# Patient Record
Sex: Female | Born: 1995
Health system: Southern US, Community
[De-identification: ages and names within clinical notes are randomized; demographics above are authoritative.]

---
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
## 2016-10-24 ENCOUNTER — Encounter: Payer: Self-pay | Admitting: Gynecology

## 2016-11-27 ENCOUNTER — Telehealth: Payer: Self-pay | Admitting: Family

## 2017-01-21 ENCOUNTER — Ambulatory Visit (INDEPENDENT_AMBULATORY_CARE_PROVIDER_SITE_OTHER): Payer: Managed Care, Other (non HMO) | Admitting: Family

## 2017-01-21 ENCOUNTER — Encounter: Payer: Self-pay | Admitting: Family

## 2017-01-21 VITALS — BP 118/72 | HR 76 | Temp 98.2°F | Resp 16 | Ht 63.0 in | Wt 123.8 lb

## 2017-01-21 DIAGNOSIS — Z0000 Encounter for general adult medical examination without abnormal findings: Secondary | ICD-10-CM

## 2017-01-21 DIAGNOSIS — E282 Polycystic ovarian syndrome: Secondary | ICD-10-CM | POA: Diagnosis not present

## 2017-01-21 MED ORDER — SPIRONOLACTONE 25 MG PO TABS: 25.0000 mg | ORAL_TABLET | Freq: Every day | ORAL | 3 refills | Status: DC

## 2017-01-21 MED ORDER — DROSPIRENONE-ETHINYL ESTRADIOL 3-0.02 MG PO TABS: 1.0000 | ORAL_TABLET | Freq: Every day | ORAL | 4 refills | Status: DC

## 2017-01-21 NOTE — Patient Instructions (Signed)
Thank you for choosing [facility].  SUMMARY AND INSTRUCTIONS:  Please continue to take your medication as prescribed.   You are doing great!  Good luck this semester!  Medication:  Your prescription(s) have been submitted to your pharmacy or been printed and provided for you. Please take as directed and contact our office if you believe you are having problem(s) with the medication(s) or have any questions.  Labs:  Please stop by the lab on the lower level of the building for your blood work. Your results will be released to [facility] (or called to you) after review, usually within 72 hours after test completion. If any changes need to be made, you will be notified at that same time.  1.) The lab is open from 7:30am to 5:30 pm Monday-Friday 2.) No appointment is necessary 3.) Fasting (if needed) is 6-8 hours after food and drink; black coffee and water are okay   Follow up:  If your symptoms worsen or fail to improve, please contact our office for further instruction, or in case of emergency go directly to the emergency room at the closest medical facility.    Health Maintenance, Female Adopting a healthy lifestyle and getting preventive care can go a long way to promote health and wellness. Talk with your health care provider about what schedule of regular examinations is right for you. This is a good chance for you to check in with your provider about disease prevention and staying healthy. In between checkups, there are plenty of things you can do on your own. Experts have done a lot of research about which lifestyle changes and preventive measures are most likely to keep you healthy. Ask your health care provider for more information. Weight and diet Eat a healthy diet  Be sure to include plenty of vegetables, fruits, low-fat dairy products, and lean protein.  Do not eat a lot of foods high in solid fats, added sugars, or salt.  Get regular exercise. This is one of the  most important things you can do for your health. ? Most adults should exercise for at least 150 minutes each week. The exercise should increase your heart rate and make you sweat (moderate-intensity exercise). ? Most adults should also do strengthening exercises at least twice a week. This is in addition to the moderate-intensity exercise.  Maintain a healthy weight  Body mass index (BMI) is a measurement that can be used to identify possible weight problems. It estimates body fat based on height and weight. Your health care provider can help determine your BMI and help you achieve or maintain a healthy weight.  For females 26 years of age and older: ? A BMI below 18.5 is considered underweight. ? A BMI of 18.5 to 24.9 is normal. ? A BMI of 25 to 29.9 is considered overweight. ? A BMI of 30 and above is considered obese.  Watch levels of cholesterol and blood lipids  You should start having your blood tested for lipids and cholesterol at 20 years of age, then have this test every 5 years.  You may need to have your cholesterol levels checked more often if: ? Your lipid or cholesterol levels are high. ? You are older than 20 years of age. ? You are at high risk for heart disease.  Cancer screening Lung Cancer  Lung cancer screening is recommended for adults 64-23 years old who are at high risk for lung cancer because of a history of smoking.  A yearly low-dose CT  scan of the lungs is recommended for people who: ? Currently smoke. ? Have quit within the past 15 years. ? Have at least a 30-pack-year history of smoking. A pack year is smoking an average of one pack of cigarettes a day for 1 year.  Yearly screening should continue until it has been 15 years since you quit.  Yearly screening should stop if you develop a health problem that would prevent you from having lung cancer treatment.  Breast Cancer  Practice breast self-awareness. This means understanding how your breasts  normally appear and feel.  It also means doing regular breast self-exams. Let your health care provider know about any changes, no matter how small.  If you are in your 20s or 30s, you should have a clinical breast exam (CBE) by a health care provider every 1-3 years as part of a regular health exam.  If you are 3 or older, have a CBE every year. Also consider having a breast X-ray (mammogram) every year.  If you have a family history of breast cancer, talk to your health care provider about genetic screening.  If you are at high risk for breast cancer, talk to your health care provider about having an MRI and a mammogram every year.  Breast cancer gene (BRCA) assessment is recommended for women who have family members with BRCA-related cancers. BRCA-related cancers include: ? Breast. ? Ovarian. ? Tubal. ? Peritoneal cancers.  Results of the assessment will determine the need for genetic counseling and BRCA1 and BRCA2 testing.  Cervical Cancer Your health care provider may recommend that you be screened regularly for cancer of the pelvic organs (ovaries, uterus, and vagina). This screening involves a pelvic examination, including checking for microscopic changes to the surface of your cervix (Pap test). You may be encouraged to have this screening done every 3 years, beginning at age 41.  For women ages 109-65, health care providers may recommend pelvic exams and Pap testing every 3 years, or they may recommend the Pap and pelvic exam, combined with testing for human papilloma virus (HPV), every 5 years. Some types of HPV increase your risk of cervical cancer. Testing for HPV may also be done on women of any age with unclear Pap test results.  Other health care providers may not recommend any screening for nonpregnant women who are considered low risk for pelvic cancer and who do not have symptoms. Ask your health care provider if a screening pelvic exam is right for you.  If you have had  past treatment for cervical cancer or a condition that could lead to cancer, you need Pap tests and screening for cancer for at least 20 years after your treatment. If Pap tests have been discontinued, your risk factors (such as having a new sexual partner) need to be reassessed to determine if screening should resume. Some women have medical problems that increase the chance of getting cervical cancer. In these cases, your health care provider may recommend more frequent screening and Pap tests.  Colorectal Cancer  This type of cancer can be detected and often prevented.  Routine colorectal cancer screening usually begins at 20 years of age and continues through 20 years of age.  Your health care provider may recommend screening at an earlier age if you have risk factors for colon cancer.  Your health care provider may also recommend using home test kits to check for hidden blood in the stool.  A small camera at the end of a tube can  be used to examine your colon directly (sigmoidoscopy or colonoscopy). This is done to check for the earliest forms of colorectal cancer.  Routine screening usually begins at age 33.  Direct examination of the colon should be repeated every 5-10 years through 20 years of age. However, you may need to be screened more often if early forms of precancerous polyps or small growths are found.  Skin Cancer  Check your skin from head to toe regularly.  Tell your health care provider about any new moles or changes in moles, especially if there is a change in a mole's shape or color.  Also tell your health care provider if you have a mole that is larger than the size of a pencil eraser.  Always use sunscreen. Apply sunscreen liberally and repeatedly throughout the day.  Protect yourself by wearing long sleeves, pants, a wide-brimmed hat, and sunglasses whenever you are outside.  Heart disease, diabetes, and high blood pressure  High blood pressure causes heart  disease and increases the risk of stroke. High blood pressure is more likely to develop in: ? People who have blood pressure in the high end of the normal range (130-139/85-89 mm Hg). ? People who are overweight or obese. ? People who are African American.  If you are 21-30 years of age, have your blood pressure checked every 3-5 years. If you are 35 years of age or older, have your blood pressure checked every year. You should have your blood pressure measured twice-once when you are at a hospital or clinic, and once when you are not at a hospital or clinic. Record the average of the two measurements. To check your blood pressure when you are not at a hospital or clinic, you can use: ? An automated blood pressure machine at a pharmacy. ? A home blood pressure monitor.  If you are between 61 years and 50 years old, ask your health care provider if you should take aspirin to prevent strokes.  Have regular diabetes screenings. This involves taking a blood sample to check your fasting blood sugar level. ? If you are at a normal weight and have a low risk for diabetes, have this test once every three years after 20 years of age. ? If you are overweight and have a high risk for diabetes, consider being tested at a younger age or more often. Preventing infection Hepatitis B  If you have a higher risk for hepatitis B, you should be screened for this virus. You are considered at high risk for hepatitis B if: ? You were born in a country where hepatitis B is common. Ask your health care provider which countries are considered high risk. ? Your parents were born in a high-risk country, and you have not been immunized against hepatitis B (hepatitis B vaccine). ? You have HIV or AIDS. ? You use needles to inject street drugs. ? You live with someone who has hepatitis B. ? You have had sex with someone who has hepatitis B. ? You get hemodialysis treatment. ? You take certain medicines for conditions,  including cancer, organ transplantation, and autoimmune conditions.  Hepatitis C  Blood testing is recommended for: ? Everyone born from [birth year] through 1964. ? Anyone with known risk factors for hepatitis C.  Sexually transmitted infections (STIs)  You should be screened for sexually transmitted infections (STIs) including gonorrhea and chlamydia if: ? You are sexually active and are younger than 20 years of age. ? You are older than 20 years  of age and your health care provider tells you that you are at risk for this type of infection. ? Your sexual activity has changed since you were last screened and you are at an increased risk for chlamydia or gonorrhea. Ask your health care provider if you are at risk.  If you do not have HIV, but are at risk, it may be recommended that you take a prescription medicine daily to prevent HIV infection. This is called pre-exposure prophylaxis (PrEP). You are considered at risk if: ? You are sexually active and do not regularly use condoms or know the HIV status of your partner(s). ? You take drugs by injection. ? You are sexually active with a partner who has HIV.  Talk with your health care provider about whether you are at high risk of being infected with HIV. If you choose to begin PrEP, you should first be tested for HIV. You should then be tested every 3 months for as long as you are taking PrEP. Pregnancy  If you are premenopausal and you may become pregnant, ask your health care provider about preconception counseling.  If you may become pregnant, take 400 to 800 micrograms (mcg) of folic acid every day.  If you want to prevent pregnancy, talk to your health care provider about birth control (contraception). Osteoporosis and menopause  Osteoporosis is a disease in which the bones lose minerals and strength with aging. This can result in serious bone fractures. Your risk for osteoporosis can be identified using a bone density scan.  If you are  47 years of age or older, or if you are at risk for osteoporosis and fractures, ask your health care provider if you should be screened.  Ask your health care provider whether you should take a calcium or vitamin D supplement to lower your risk for osteoporosis.  Menopause may have certain physical symptoms and risks.  Hormone replacement therapy may reduce some of these symptoms and risks. Talk to your health care provider about whether hormone replacement therapy is right for you. Follow these instructions at home:  Schedule regular health, dental, and eye exams.  Stay current with your immunizations.  Do not use any tobacco products including cigarettes, chewing tobacco, or electronic cigarettes.  If you are pregnant, do not drink alcohol.  If you are breastfeeding, limit how much and how often you drink alcohol.  Limit alcohol intake to no more than 1 drink per day for nonpregnant women. One drink equals 12 ounces of beer, 5 ounces of wine, or 1 ounces of hard liquor.  Do not use street drugs.  Do not share needles.  Ask your health care provider for help if you need support or information about quitting drugs.  Tell your health care provider if you often feel depressed.  Tell your health care provider if you have ever been abused or do not feel safe at home. This information is not intended to replace advice given to you by your health care provider. Make sure you discuss any questions you have with your health care provider. Document Released: 12/11/2010 Document Revised: 11/03/2015 Document Reviewed: 03/01/2015 Elsevier Interactive Patient Education  [employer].

## 2017-01-21 NOTE — Progress Notes (Signed)
Subjective:    Patient ID: Stephanie Daugherty, female    DOB: December 31, 1995, 21 y.o.   MRN: [id number]  Chief Complaint  Patient presents with  . CPE    not fasting    HPI:  Stephanie Daugherty is a 21 y.o. female who presents today for an annual wellness visit.   1) Health Maintenance -   Diet - Averaging about 2-3 meals per day consisting of a regular diet.   Exercise - Continues to walk daily and on campus   2) Preventative Exams / Immunizations:  Dental -- Due for exam   Vision -- Up to date   Health Maintenance  Topic Date Due  . CHLAMYDIA SCREENING  08/23/2011  . HIV Screening  08/23/2011  . TETANUS/TDAP  08/23/2015  . INFLUENZA VACCINE  01/09/2017    Immunization History  Administered Date(s) Administered  . HPV 9-valent 01/19/2015, 12/26/2015    No Known Allergies   Outpatient Medications Prior to Visit  Medication Sig Dispense Refill  . drospirenone-ethinyl estradiol (YAZ) 3-0.02 MG tablet Take 1 tablet by mouth daily. 3 Package 4  . spironolactone (ALDACTONE) 25 MG tablet Take 1 tablet (25 mg total) by mouth daily. 90 tablet 4   No facility-administered medications prior to visit.      History reviewed. No pertinent past medical history.   History reviewed. No pertinent surgical history.   Family History  Problem Relation Age of Onset  . Asthma Mother   . Healthy Father   . Rheum arthritis Maternal Grandmother   . Hypertension Maternal Grandmother   . Healthy Maternal Grandfather   . Rheum arthritis Paternal Grandmother   . Hypertension Paternal Grandmother   . Healthy Paternal Grandfather      Social History   Social History  . Marital status: Single    Spouse name: N/A  . Number of children: 0  . Years of education: 22   Occupational History  . 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 Smoking status: Never Smoker  . Smokeless tobacco: Never Used  . Alcohol use No  . Drug use: No  . Sexual  activity: No   Other Topics Concern  . Not on file   Social History Narrative   Fun: Read, crochet    Denies abuse and feels safe where she lives      Review of Systems  Constitutional: Denies fever, chills, fatigue, or significant weight gain/loss. HENT: Head: Denies headache or neck pain Ears: Denies changes in hearing, ringing in ears, earache, drainage Nose: Denies discharge, stuffiness, itching, nosebleed, sinus pain Throat: Denies sore throat, hoarseness, dry mouth, sores, thrush Eyes: Denies loss/changes in vision, pain, redness, blurry/double vision, flashing lights Cardiovascular: Denies chest pain/discomfort, tightness, palpitations, shortness of breath with activity, difficulty lying down, swelling, sudden awakening with shortness of breath Respiratory: Denies shortness of breath, cough, sputum production, wheezing Gastrointestinal: Denies dysphasia, heartburn, change in appetite, nausea, change in bowel habits, rectal bleeding, constipation, diarrhea, yellow skin or eyes Genitourinary: Denies frequency, urgency, burning/pain, blood in urine, incontinence, change in urinary strength. Musculoskeletal: Denies muscle/joint pain, stiffness, back pain, redness or swelling of joints, trauma Skin: Denies rashes, lumps, itching, dryness, color changes, or hair/nail changes Neurological: Denies dizziness, fainting, seizures, weakness, numbness, tingling, tremor Psychiatric - Denies nervousness, stress, depression or memory loss Endocrine: Denies heat or cold intolerance, sweating, frequent urination, excessive thirst, changes in appetite Hematologic: Denies ease of bruising or bleeding  Objective:    BP 118/72 (BP Location: Left Arm, Patient Position: Sitting, Cuff Size: Normal)   Pulse 76   Temp 98.2 F (36.8 C) (Oral)   Resp 16   Ht 5\' 3"  (1.6 m)   Wt 123 lb 12.8 oz (56.2 kg)   SpO2 98%   BMI 21.93 kg/m  Nursing note and vital signs reviewed.  Physical Exam    Constitutional: She is oriented to person, place, and time. She appears well-developed and well-nourished.  HENT:  Head: Normocephalic.  Right Ear: Hearing, tympanic membrane, external ear and ear canal normal.  Left Ear: Hearing, tympanic membrane, external ear and ear canal normal.  Nose: Nose normal.  Mouth/Throat: Uvula is midline, oropharynx is clear and moist and mucous membranes are normal.  Eyes: Pupils are equal, round, and reactive to light. Conjunctivae and EOM are normal.  Neck: Neck supple. No JVD present. No tracheal deviation present. No thyromegaly present.  Cardiovascular: Normal rate, regular rhythm, normal heart sounds and intact distal pulses.   Pulmonary/Chest: Effort normal and breath sounds normal.  Abdominal: Soft. Bowel sounds are normal. She exhibits no distension and no mass. There is no tenderness. There is no rebound and no guarding.  Musculoskeletal: Normal range of motion. She exhibits no edema or tenderness.  Lymphadenopathy:    She has no cervical adenopathy.  Neurological: She is alert and oriented to person, place, and time. She has normal reflexes. No cranial nerve deficit. She exhibits normal muscle tone. Coordination normal.  Skin: Skin is warm and dry.  Psychiatric: She has a normal mood and affect. Her behavior is normal. Judgment and thought content normal.       Assessment & Plan:   Problem List Items Addressed This Visit      Other   Routine general medical examination at a health care facility - Primary    1) Anticipatory Guidance: Discussed importance of wearing a seatbelt while driving and not texting while driving; changing batteries in smoke detector at least once annually; wearing suntan lotion when outside; eating a balanced and moderate diet; getting physical activity at least 30 minutes per day.  2) Immunizations / Screenings / Labs:  Discussed HPV with recommendation for restarting series as doses were given significantly far  apart. All other immunizations are up-to-date per recommendations. Due for a dental exam encouraged to be completed independently. All other screenings are up-to-date per recommendations. Obtain CBC, CMET, and lipid profile.    Overall healthy exam as she is of good weight and exercises regularly. Currently studying biology with ideas of premed. Continue healthy lifestyle behaviors and choices. Follow-up prevention exam in 1 year. Follow-up office visit pending blood work and as needed.      Relevant Orders   CBC   Comprehensive metabolic panel   Lipid panel    Other Visit Diagnoses    PCOS (polycystic ovarian syndrome)       Relevant Medications   spironolactone (ALDACTONE) 25 MG tablet   drospirenone-ethinyl estradiol (YAZ) 3-0.02 MG tablet       I am having Ms. Bo maintain her spironolactone and drospirenone-ethinyl estradiol.   Meds ordered this encounter  Medications  . spironolactone (ALDACTONE) 25 MG tablet    Sig: Take 1 tablet (25 mg total) by mouth daily.    Dispense:  90 tablet    Refill:  3    Order Specific Question:   Supervising Provider    Answer:   Hillard DankerRAWFORD, ELIZABETH A [[id number]]  . drospirenone-ethinyl estradiol (YAZ)  3-0.02 MG tablet    Sig: Take 1 tablet by mouth daily.    Dispense:  3 Package    Refill:  4    Order Specific Question:   Supervising Provider    Answer:   Hillard Danker A [[id number]]     Follow-up: Return in about 1 year (around 01/21/2018), or if symptoms worsen or fail to improve.   Jeanine Luz, FNP

## 2017-01-21 NOTE — Assessment & Plan Note (Signed)
1) Anticipatory Guidance: Discussed importance of wearing a seatbelt while driving and not texting while driving; changing batteries in smoke detector at least once annually; wearing suntan lotion when outside; eating a balanced and moderate diet; getting physical activity at least 30 minutes per day.  2) Immunizations / Screenings / Labs:  Discussed HPV with recommendation for restarting series as doses were given significantly far apart. All other immunizations are up-to-date per recommendations. Due for a dental exam encouraged to be completed independently. All other screenings are up-to-date per recommendations. Obtain CBC, CMET, and lipid profile.    Overall healthy exam as she is of good weight and exercises regularly. Currently studying biology with ideas of premed. Continue healthy lifestyle behaviors and choices. Follow-up prevention exam in 1 year. Follow-up office visit pending blood work and as needed.

## 2017-01-23 ENCOUNTER — Other Ambulatory Visit (INDEPENDENT_AMBULATORY_CARE_PROVIDER_SITE_OTHER): Payer: Managed Care, Other (non HMO)

## 2017-01-23 ENCOUNTER — Encounter: Payer: Self-pay | Admitting: Family

## 2017-01-23 DIAGNOSIS — Z0000 Encounter for general adult medical examination without abnormal findings: Secondary | ICD-10-CM | POA: Diagnosis not present

## 2017-01-23 LAB — LIPID PANEL
CHOLESTEROL: 173 mg/dL (ref 0–200)
HDL: 52.7 mg/dL (ref >39.00)
NonHDL: 120.26
Total CHOL/HDL Ratio: 3
Triglycerides: 239 mg/dL — ABNORMAL HIGH (ref 0.0–149.0)
VLDL: 47.8 mg/dL — AB (ref 0.0–40.0)

## 2017-01-23 LAB — CBC
HEMATOCRIT: 40.2 % (ref 36.0–46.0)
HEMOGLOBIN: 13.9 g/dL (ref 12.0–15.0)
MCHC: 34.4 g/dL (ref 30.0–36.0)
MCV: 86.1 fl (ref 78.0–100.0)
Platelets: 204 10*3/uL (ref 150.0–400.0)
RBC: 4.67 Mil/uL (ref 3.87–5.11)
RDW: 13 % (ref 11.5–14.6)
WBC: 7 10*3/uL (ref 4.5–10.5)

## 2017-01-23 LAB — COMPREHENSIVE METABOLIC PANEL
ALBUMIN: 3.9 g/dL (ref 3.5–5.2)
ALK PHOS: 48 U/L (ref 39–117)
ALT: 7 U/L (ref 0–35)
AST: 13 U/L (ref 0–37)
BILIRUBIN TOTAL: 0.3 mg/dL (ref 0.2–1.2)
BUN: 11 mg/dL (ref 6–23)
CO2: 24 mEq/L (ref 19–32)
Calcium: 9.1 mg/dL (ref 8.4–10.5)
Chloride: 107 mEq/L (ref 96–112)
Creatinine, Ser: 0.82 mg/dL (ref 0.40–1.20)
GFR: 94.06 mL/min (ref >60.00)
Glucose, Bld: 91 mg/dL (ref 70–99)
POTASSIUM: 4.1 meq/L (ref 3.5–5.1)
Sodium: 140 mEq/L (ref 135–145)
TOTAL PROTEIN: 6.2 g/dL (ref 6.0–8.3)

## 2017-01-23 LAB — LDL CHOLESTEROL, DIRECT: Direct LDL: 101 mg/dL

## 2017-03-13 ENCOUNTER — Ambulatory Visit (INDEPENDENT_AMBULATORY_CARE_PROVIDER_SITE_OTHER): Payer: Managed Care, Other (non HMO)

## 2017-03-13 DIAGNOSIS — Z23 Encounter for immunization: Secondary | ICD-10-CM | POA: Diagnosis not present

## 2017-05-13 IMAGING — DX DG HIP (WITH OR WITHOUT PELVIS) 2-3V*R*
2 series · 2 of 2 positions shown · non-contrast
Comparison: None in PACs

CLINICAL DATA: Right hip pops when standing, intermittent pain for
the past 3 months; born with a hip click.

EXAM:
DG HIP (WITH OR WITHOUT PELVIS) 2-3V RIGHT

[hip ap]
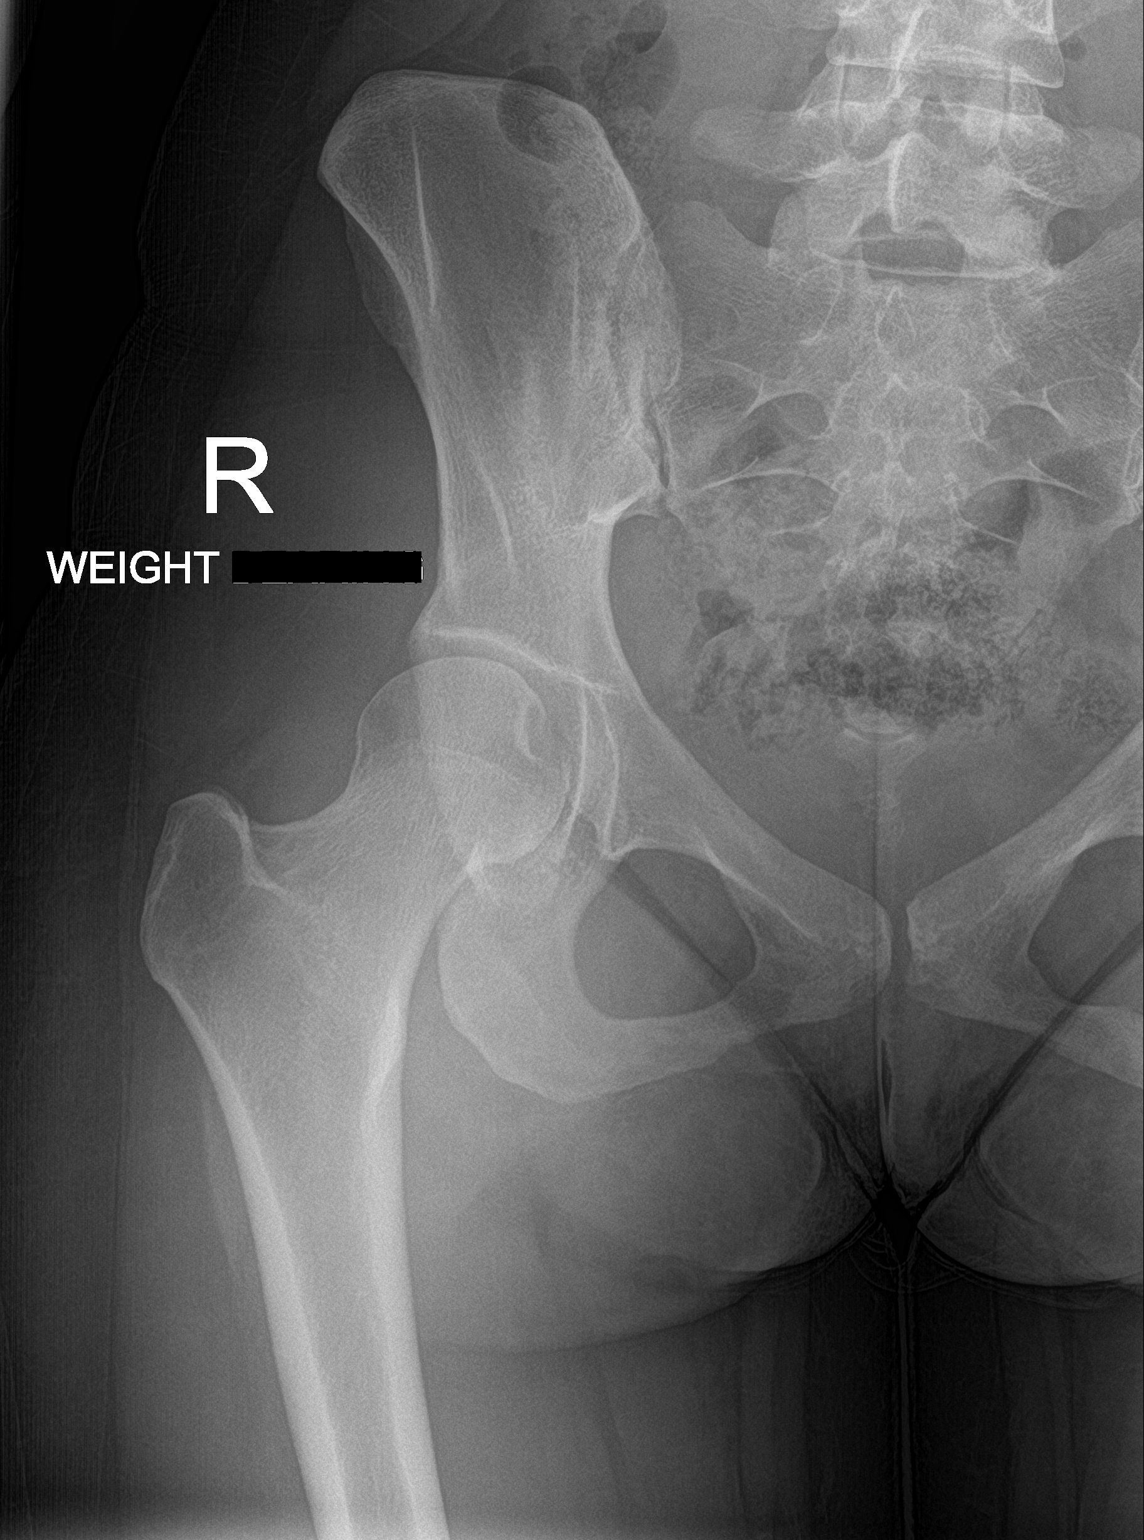

[hip lat]
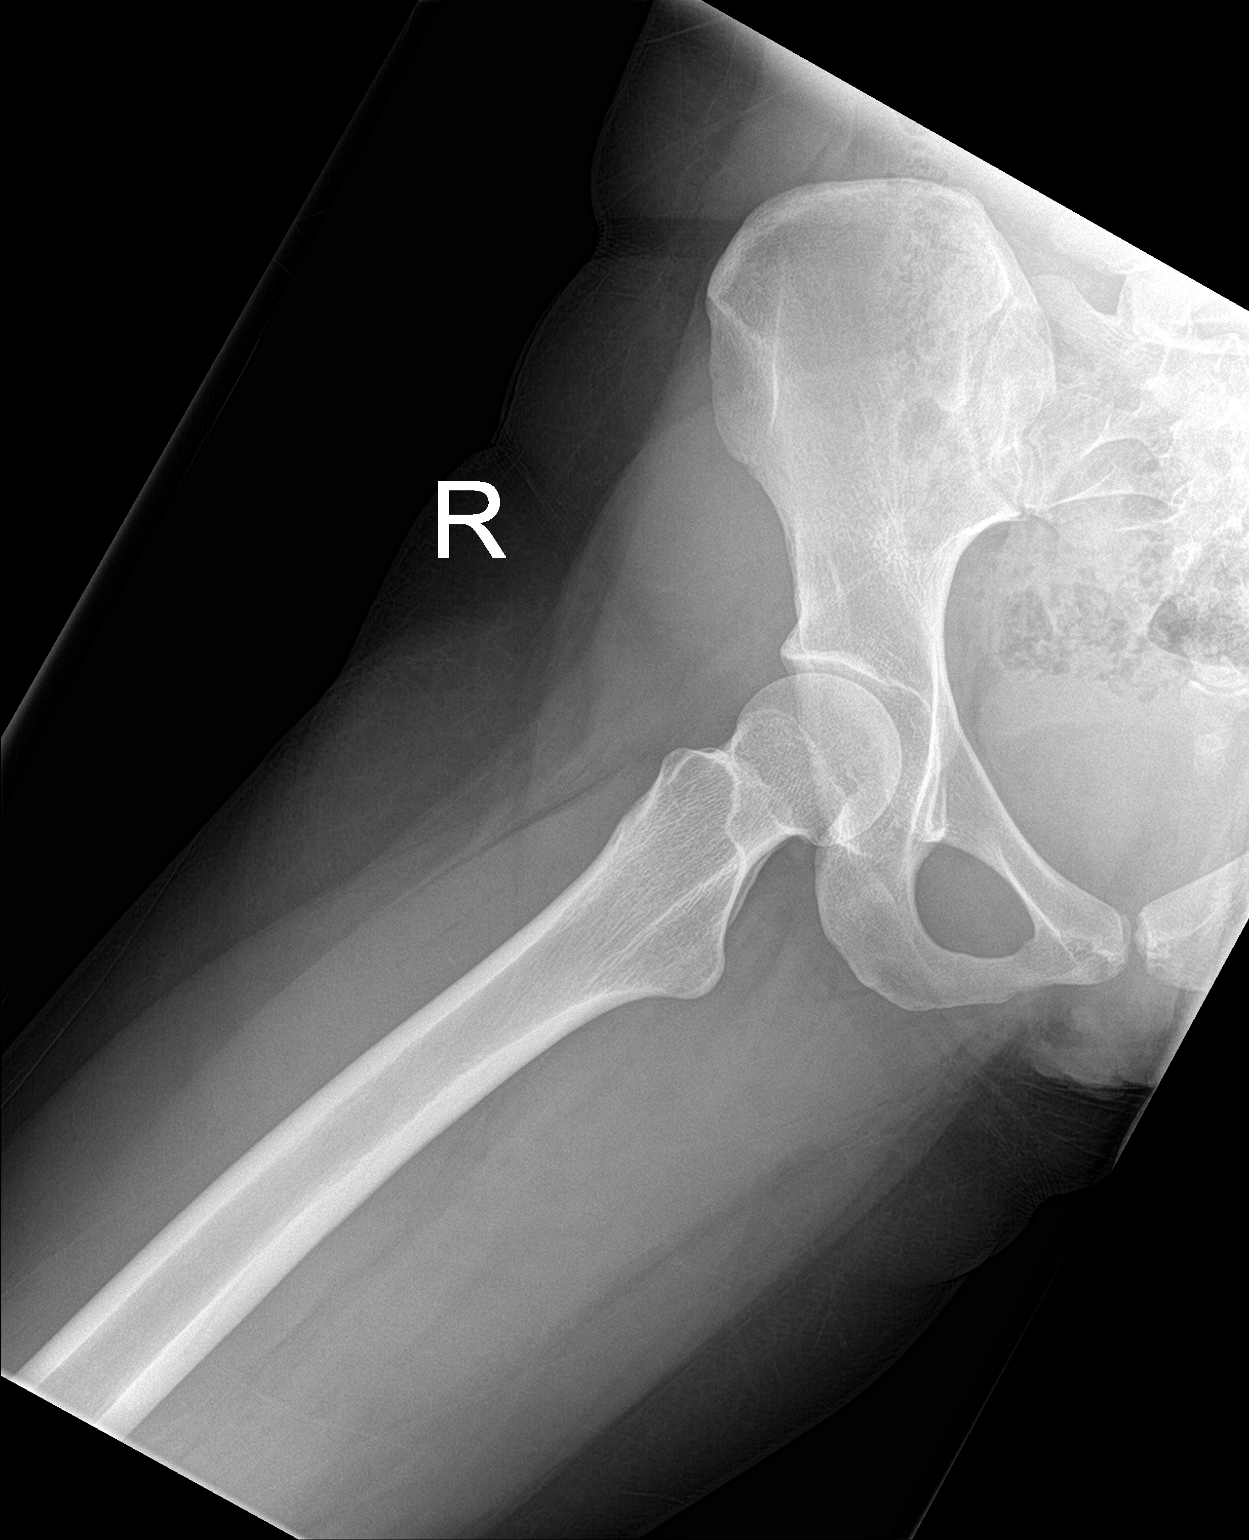

[2 of 2 positions shown; findings below may reference images not displayed]

FINDINGS: The right hemipelvis and proximal femur are subjectively adequately
mineralized. There is no lytic or blastic lesion. No acute or old
fracture is observed. The contour of the acetabulum appears normal.
The articular surface of the femoral head and acetabulum appears
smoothly rounded. The femoral neck, intertrochanteric, and
subtrochanteric regions are normal.
IMPRESSION: No acute or chronic bony abnormality of the right hip is observed.
Given the patient's clinical history and symptoms, right hip MRI
would be useful in an effort to detect early changes of avascular
necrosis or other occult joint abnormality.

## 2018-02-09 ENCOUNTER — Other Ambulatory Visit: Payer: Self-pay | Admitting: Family

## 2018-02-09 DIAGNOSIS — E282 Polycystic ovarian syndrome: Secondary | ICD-10-CM

## 2018-04-11 ENCOUNTER — Other Ambulatory Visit: Payer: Self-pay | Admitting: Family

## 2018-04-11 DIAGNOSIS — E282 Polycystic ovarian syndrome: Secondary | ICD-10-CM

## 2018-09-18 MED FILL — DROSPIR-ETH ESTRA 3/.02 MG: 3-0.02 | 84 days supply | Qty: 84 | Fill #0

## 2018-12-15 MED FILL — DROSPIR-ETH ESTRA 3/.02 MG: 3-0.02 | 84 days supply | Qty: 84 | Fill #1

## 2018-12-15 MED FILL — SPIRONOLACTONE 25 MG TABS: 25 | 90 days supply | Qty: 90 | Fill #0

## 2018-12-26 ENCOUNTER — Encounter: Payer: Self-pay | Admitting: Family

## 2018-12-26 ENCOUNTER — Ambulatory Visit (INDEPENDENT_AMBULATORY_CARE_PROVIDER_SITE_OTHER): Payer: 59 | Admitting: Family

## 2018-12-26 ENCOUNTER — Other Ambulatory Visit: Payer: Self-pay

## 2018-12-26 VITALS — BP 114/80 | HR 94 | Temp 98.3°F | Ht 63.0 in | Wt 126.8 lb

## 2018-12-26 DIAGNOSIS — Z23 Encounter for immunization: Secondary | ICD-10-CM | POA: Diagnosis not present

## 2018-12-26 DIAGNOSIS — Z0000 Encounter for general adult medical examination without abnormal findings: Secondary | ICD-10-CM

## 2018-12-26 MED ORDER — MAGIC MOUTHWASH: 5.0000 mL | Freq: Three times a day (TID) | ORAL | 0 refills | Status: AC | PRN

## 2018-12-26 NOTE — Progress Notes (Signed)
Stephanie MajesticHeather Daugherty is a 23 y.o. female with the following history as recorded in Epic[employer]:  Patient Active Problem List   Diagnosis Date Noted  . Need for HPV vaccination 12/26/2015  . Right hip pain 12/26/2015  . Elevated testosterone level in female 01/03/2015  . Routine general medical examination at a health care facility 12/23/2014  . Birth control counseling 12/23/2014    Current Outpatient Medications  Medication Sig Dispense Refill  . drospirenone-ethinyl estradiol (YAZ) 3-0.02 MG tablet Take 1 tablet by mouth daily. 3 Package 4  . spironolactone (ALDACTONE) 25 MG tablet Take 1 tablet (25 mg total) by mouth daily. 90 tablet 3  . magic mouthwash SOLN Take 5 mLs by mouth 3 (three) times daily as needed for mouth pain. 150 mL 0   No current facility-administered medications for this visit.     Allergies: Patient has no known allergies.  History reviewed. No pertinent past medical history.  History reviewed. No pertinent surgical history.  Family History  Problem Relation Age of Onset  . Asthma Mother   . Healthy Father   . Rheum arthritis Maternal Grandmother   . Hypertension Maternal Grandmother   . Healthy Maternal Grandfather   . Rheum arthritis Paternal Grandmother   . Hypertension Paternal Grandmother   . Healthy Paternal Grandfather     Social History   Tobacco Use  . Smoking status: Never Smoker  . Smokeless tobacco: Never Used  Substance Use Topics  . Alcohol use: No    Alcohol/week: 0.0 standard drinks    Subjective:  Patient presents for yearly CPE: has not been seen in our office since August 2018;  Has been getting refills on her Yaz,Spironolactone and is actively taking this medication- patient is not sure who has been writing her medication for her; LMP- last week;  Needs to see GYN- last seen there in 2016; will need to get baseline pap smear Worried about recurrent canker sores- Sees dentist once per year;    Review of Systems  Constitutional:  Negative.   HENT: Negative.   Eyes: Negative.   Respiratory: Negative.   Cardiovascular: Negative.   Gastrointestinal: Negative.   Genitourinary: Negative.   Musculoskeletal: Negative.   Skin: Negative.   Neurological: Negative.   Endo/Heme/Allergies: Negative.   Psychiatric/Behavioral: Negative.       Objective:  Vitals:   12/26/18 1503  BP: 114/80  Pulse: 94  Temp: 98.3 F (36.8 C)  TempSrc: Oral  SpO2: 97%  Weight: 126 lb 12.8 oz (57.5 kg)  Height: 5\' 3"  (1.6 m)    General: Well developed, well nourished, in no acute distress  Skin : Warm and dry.  Head: Normocephalic and atraumatic  Eyes: Sclera and conjunctiva clear; pupils round and reactive to light; extraocular movements intact  Ears: External normal; canals clear; tympanic membranes normal  Oropharynx: Pink, supple. No suspicious lesions  Neck: Supple without thyromegaly, adenopathy  Lungs: Respirations unlabored; clear to auscultation bilaterally without wheeze, rales, rhonchi  CVS exam: normal rate and regular rhythm.  Abdomen: Soft; nontender; nondistended; normoactive bowel sounds; no masses or hepatosplenomegaly  Musculoskeletal: No deformities; no active joint inflammation  Extremities: No edema, cyanosis, clubbing  Vessels: Symmetric bilaterally  Neurologic: Alert and oriented; speech intact; face symmetrical; moves all extremities well; CNII-XII intact without focal deficit   Assessment:  1. PE (physical exam), annual     Plan:  Age appropriate preventive healthcare needs addressed; encouraged regular eye doctor and dental exams; encouraged regular exercise; will update labs and refills  as needed today; follow-up to be determined; Needs to see her GYN- will not update refills today- patient needs to get baseline pap smear; Tdap updated; Follow-up in 1-2 years;   No follow-ups on file.  Orders Placed This Encounter  Procedures  . Tdap vaccine greater than or equal to 7yo IM    Requested  Prescriptions   Signed Prescriptions Disp Refills  . magic mouthwash SOLN 150 mL 0    Sig: Take 5 mLs by mouth 3 (three) times daily as needed for mouth pain.

## 2019-01-05 MED FILL — SPIRONOLACTONE 25 MG TABS: 25 | 90 days supply | Qty: 90 | Fill #0

## 2019-03-09 MED FILL — DROSPIR-ETH ESTRA 3/.02 MG: 3-0.02 | 84 days supply | Qty: 84 | Fill #2

## 2019-04-07 MED FILL — SPIRONOLACTONE 25 MG TABS: 25 | 90 days supply | Qty: 90 | Fill #1

## 2019-06-09 ENCOUNTER — Telehealth: Payer: Self-pay | Admitting: Family

## 2019-06-09 NOTE — Telephone Encounter (Signed)
Copied from [place] [id number]. Topic: Quick Communication - Rx Refill/Question >> Jun 09, 2019  4:24 PM Izola Price, [place] A wrote: Medication: drospirenone-ethinyl estradiol (YAZ) 3-0.02 MG tablet, spironolactone (ALDACTONE) 25 MG tablet (Patient is completely out of medication and pharmacy has tried reaching out multiple times to get medication signed off on since last week.)  Has the patient contacted their pharmacy? {Yes (Agent: If no, request that the patient contact the pharmacy for the refill.) (Agent: If yes, when and what did the pharmacy advise?)Contact PCP  Preferred Pharmacy (with phone number or street name): [facility], [place]  Phone:  [phone] Fax:  [phone]     Agent: Please be advised that RX refills may take up to 3 business days. We ask that you follow-up with your pharmacy.

## 2019-06-10 ENCOUNTER — Ambulatory Visit (INDEPENDENT_AMBULATORY_CARE_PROVIDER_SITE_OTHER): Payer: 59 | Admitting: Internal Medicine

## 2019-06-10 ENCOUNTER — Other Ambulatory Visit: Payer: Self-pay

## 2019-06-10 ENCOUNTER — Encounter: Payer: Self-pay | Admitting: Internal Medicine

## 2019-06-10 VITALS — BP 118/78 | HR 82 | Temp 98.7°F | Ht 63.0 in | Wt 135.0 lb

## 2019-06-10 DIAGNOSIS — E282 Polycystic ovarian syndrome: Secondary | ICD-10-CM | POA: Diagnosis not present

## 2019-06-10 DIAGNOSIS — Z23 Encounter for immunization: Secondary | ICD-10-CM

## 2019-06-10 DIAGNOSIS — Z30019 Encounter for initial prescription of contraceptives, unspecified: Secondary | ICD-10-CM | POA: Diagnosis not present

## 2019-06-10 DIAGNOSIS — Z3009 Encounter for other general counseling and advice on contraception: Secondary | ICD-10-CM

## 2019-06-10 LAB — LIPID PANEL
Cholesterol: 228 mg/dL — ABNORMAL HIGH (ref 0–200)
HDL: 55.9 mg/dL (ref >39.00)
NonHDL: 172.07
Total CHOL/HDL Ratio: 4
Triglycerides: 299 mg/dL — ABNORMAL HIGH (ref 0.0–149.0)
VLDL: 59.8 mg/dL — ABNORMAL HIGH (ref 0.0–40.0)

## 2019-06-10 LAB — COMPREHENSIVE METABOLIC PANEL
ALT: 7 U/L (ref 0–35)
AST: 14 U/L (ref 0–37)
Albumin: 4.2 g/dL (ref 3.5–5.2)
Alkaline Phosphatase: 63 U/L (ref 39–117)
BUN: 9 mg/dL (ref 6–23)
CO2: 26 mEq/L (ref 19–32)
Calcium: 9.4 mg/dL (ref 8.4–10.5)
Chloride: 105 mEq/L (ref 96–112)
Creatinine, Ser: 0.8 mg/dL (ref 0.40–1.20)
GFR: 89.05 mL/min (ref >60.00)
Glucose, Bld: 91 mg/dL (ref 70–99)
Potassium: 3.8 mEq/L (ref 3.5–5.1)
Sodium: 140 mEq/L (ref 135–145)
Total Bilirubin: 0.4 mg/dL (ref 0.2–1.2)
Total Protein: 7 g/dL (ref 6.0–8.3)

## 2019-06-10 LAB — CBC
HCT: 42.2 % (ref 36.0–46.0)
Hemoglobin: 14.1 g/dL (ref 12.0–15.0)
MCHC: 33.5 g/dL (ref 30.0–36.0)
MCV: 84.5 fl (ref 78.0–100.0)
Platelets: 241 10*3/uL (ref 150.0–400.0)
RBC: 5 Mil/uL (ref 3.87–5.11)
RDW: 13.1 % (ref 11.5–15.5)
WBC: 5.2 10*3/uL (ref 4.0–10.5)

## 2019-06-10 LAB — LDL CHOLESTEROL, DIRECT: Direct LDL: 135 mg/dL

## 2019-06-10 LAB — POCT URINE PREGNANCY: Preg Test, Ur: NEGATIVE

## 2019-06-10 MED ORDER — SPIRONOLACTONE 25 MG PO TABS: 25.0000 mg | ORAL_TABLET | Freq: Every day | ORAL | 0 refills | Status: AC

## 2019-06-10 MED ORDER — DROSPIRENONE-ETHINYL ESTRADIOL 3-0.02 MG PO TABS: 1.0000 | ORAL_TABLET | Freq: Every day | ORAL | 0 refills | Status: DC

## 2019-06-10 MED FILL — DROSPIR-ETH ESTRA 3/.02 MG: 3-0.02 | 84 days supply | Qty: 84 | Fill #0

## 2019-06-10 NOTE — Progress Notes (Signed)
   Subjective:   Patient ID: Stephanie Daugherty, female    DOB: 23-Sep-1995, 23 y.o.   MRN: [id number]  HPI The patient is a 23 YO female coming in for needing birth control refilled (seen by PCP in July and advised to see ob/gyn, taking yaz, denies side effects or missing doses, LMP 05/25/19) and elevated testosterone levels/PCOS (taking spironolactone, last filled 2018, claims still taking, denies side effects, last labs we have 2018, feels it is helping).   Review of Systems  Constitutional: Negative.   HENT: Negative.   Eyes: Negative.   Respiratory: Negative for cough, chest tightness and shortness of breath.   Cardiovascular: Negative for chest pain, palpitations and leg swelling.  Gastrointestinal: Negative for abdominal distention, abdominal pain, constipation, diarrhea, nausea and vomiting.  Musculoskeletal: Negative.   Skin: Negative.   Neurological: Negative.   Psychiatric/Behavioral: Negative.     Objective:  Physical Exam Constitutional:      Appearance: She is well-developed.  HENT:     Head: Normocephalic and atraumatic.  Cardiovascular:     Rate and Rhythm: Normal rate and regular rhythm.  Pulmonary:     Effort: Pulmonary effort is normal. No respiratory distress.     Breath sounds: Normal breath sounds. No wheezing or rales.  Abdominal:     General: Bowel sounds are normal. There is no distension.     Palpations: Abdomen is soft.     Tenderness: There is no abdominal tenderness. There is no rebound.  Musculoskeletal:     Cervical back: Normal range of motion.  Skin:    General: Skin is warm and dry.  Neurological:     Mental Status: She is alert and oriented to person, place, and time.     Coordination: Coordination normal.     Vitals:   06/10/19 1101  BP: 118/78  Pulse: 82  Temp: 98.7 F (37.1 C)  TempSrc: Oral  SpO2: 99%  Weight: 135 lb (61.2 kg)  Height: 5\' 3"  (1.6 m)    This visit occurred during the SARS-CoV-2 public health emergency.  Safety  protocols were in place, including screening questions prior to the visit, additional usage of staff PPE, and extensive cleaning of exam room while observing appropriate contact time as indicated for disinfecting solutions.   Assessment & Plan:  Flu shot given at visit

## 2019-06-10 NOTE — Telephone Encounter (Signed)
She needs to be seen to have her K+ level checked  Stephanie Daugherty

## 2019-06-10 NOTE — Telephone Encounter (Signed)
Patient preferred morning appointment. Made appointment to see Dr.Crawford today at 10:40 am.

## 2019-06-10 NOTE — Patient Instructions (Signed)
We have refilled the medicines and are checking the labs today.

## 2019-06-11 NOTE — Assessment & Plan Note (Signed)
Pregnancy test done and negative given lapse in refill dates although she states taking which is negative. Given 3 month supply and reminded that her PCP wants her to establish with gyn.

## 2019-06-11 NOTE — Assessment & Plan Note (Signed)
Taking spironolactone. Given 3 month supply and checking labs today. Adjust if needed based on labs.

## 2019-06-19 MED FILL — SPIRONOLACTONE 25 MG TABS: 25 | 90 days supply | Qty: 90 | Fill #2

## 2019-08-08 MED FILL — SPIRONOLACTONE 25 MG TABS: 25 | 90 days supply | Qty: 90 | Fill #2

## 2019-08-10 ENCOUNTER — Other Ambulatory Visit: Payer: Self-pay | Admitting: Internal Medicine

## 2019-08-10 DIAGNOSIS — E282 Polycystic ovarian syndrome: Secondary | ICD-10-CM

## 2019-08-22 MED FILL — SPIRONOLACTONE 25 MG TABS: 25 | 90 days supply | Qty: 90 | Fill #2

## 2019-08-27 ENCOUNTER — Other Ambulatory Visit: Payer: Self-pay | Admitting: Internal Medicine

## 2019-08-27 DIAGNOSIS — E282 Polycystic ovarian syndrome: Secondary | ICD-10-CM

## 2019-09-02 ENCOUNTER — Telehealth: Payer: Self-pay | Admitting: Family

## 2019-09-02 DIAGNOSIS — E282 Polycystic ovarian syndrome: Secondary | ICD-10-CM

## 2019-09-02 NOTE — Telephone Encounter (Signed)
New message:   1.Medication Requested: drospirenone-ethinyl estradiol (YAZ) 3-0.02 MG tablet 2. Pharmacy (Name, Street, [place]): [facility] [place], [place] - [address] [place] 3. On Med List: Yes  4. Last Visit with PCP: 06/03/19  5. Next visit date with PCP:  Dr. Okey Dupre prescribed this medication. Agent: Please be advised that RX refills may take up to 3 business days. We ask that you follow-up with your pharmacy.

## 2019-09-02 NOTE — Telephone Encounter (Signed)
Stephanie Daugherty is PCP  Does she want to continue rx'ing YAZ bc

## 2019-09-04 MED ORDER — DROSPIRENONE-ETHINYL ESTRADIOL 3-0.02 MG PO TABS: 1.0000 | ORAL_TABLET | Freq: Every day | ORAL | 0 refills | Status: AC

## 2019-09-04 MED FILL — DROSPIR-ETH ESTRA 3/.02 MG: 3-0.02 | 28 days supply | Qty: 28 | Fill #0

## 2019-09-04 NOTE — Telephone Encounter (Signed)
Left message for patient with info today.

## 2019-09-04 NOTE — Telephone Encounter (Addendum)
I will give her one final refill.  She was reminded in both July 2020 and December 2020 that she was supposed to see her GYN;  Since she said she is using for PCOS, she needs to be under the care of GYN.

## 2019-09-04 NOTE — Addendum Note (Signed)
Addended by: Eustace Moore on: 09/04/2019 12:19 PM   Modules accepted: Orders

## 2019-09-04 NOTE — Telephone Encounter (Signed)
   Patient made aware refill called to pharmacy. Scheduler advised patient to contact GYN for future refills

## 2019-10-08 ENCOUNTER — Other Ambulatory Visit: Payer: Self-pay | Admitting: Family

## 2019-10-08 DIAGNOSIS — E282 Polycystic ovarian syndrome: Secondary | ICD-10-CM

## 2019-10-16 DIAGNOSIS — Z30011 Encounter for initial prescription of contraceptive pills: Secondary | ICD-10-CM | POA: Diagnosis not present

## 2019-10-16 MED FILL — DROSPIR-ETH ESTRA 3/.02 MG: 3-0.02 | 84 days supply | Qty: 84 | Fill #0

## 2020-02-25 MED FILL — DROSPIRENONE-EE 3-0.02 MG T: 3-0.02 | 84 days supply | Qty: 84 | Fill #1
